# Patient Record
Sex: Male | Born: 1975 | Race: White | Hispanic: No | Smoking: Never smoker
Health system: Southern US, Community
[De-identification: ages and names within clinical notes are randomized; demographics above are authoritative.]

## PROBLEM LIST (undated history)

## (undated) DIAGNOSIS — F909 Attention-deficit hyperactivity disorder, unspecified type: Secondary | ICD-10-CM

## (undated) DIAGNOSIS — R569 Unspecified convulsions: Secondary | ICD-10-CM

## (undated) DIAGNOSIS — N289 Disorder of kidney and ureter, unspecified: Secondary | ICD-10-CM

## (undated) DIAGNOSIS — C715 Malignant neoplasm of cerebral ventricle: Secondary | ICD-10-CM

## (undated) HISTORY — PX: TONSILLECTOMY: SUR1361

## (undated) HISTORY — PX: SINUS IRRIGATION: SHX2411

## (undated) HISTORY — PX: HAND SURGERY: SHX662

## (undated) HISTORY — PX: ADENOIDECTOMY: SUR15

## (undated) HISTORY — DX: Attention-deficit hyperactivity disorder, unspecified type: F90.9

## (undated) HISTORY — PX: EXPLORATORY LAPAROTOMY: SUR591

## (undated) HISTORY — PX: APPENDECTOMY: SHX54

## (undated) HISTORY — PX: KIDNEY STONE SURGERY: SHX686

---

## 2016-12-03 ENCOUNTER — Emergency Department (HOSPITAL_COMMUNITY): Payer: No Typology Code available for payment source

## 2016-12-03 ENCOUNTER — Emergency Department (HOSPITAL_COMMUNITY)
Admission: EM | Admit: 2016-12-03 | Discharge: 2016-12-03 | Disposition: A | Discharge status: Home / Self Care | Payer: No Typology Code available for payment source | Attending: Emergency Medicine | Admitting: Emergency Medicine

## 2016-12-03 ENCOUNTER — Encounter (HOSPITAL_COMMUNITY): Payer: Self-pay | Admitting: Emergency Medicine

## 2016-12-03 DIAGNOSIS — S0181XA Laceration without foreign body of other part of head, initial encounter: Secondary | ICD-10-CM | POA: Diagnosis not present

## 2016-12-03 DIAGNOSIS — Y92410 Unspecified street and highway as the place of occurrence of the external cause: Secondary | ICD-10-CM | POA: Insufficient documentation

## 2016-12-03 DIAGNOSIS — S0990XA Unspecified injury of head, initial encounter: Secondary | ICD-10-CM | POA: Diagnosis present

## 2016-12-03 DIAGNOSIS — Y999 Unspecified external cause status: Secondary | ICD-10-CM | POA: Diagnosis not present

## 2016-12-03 DIAGNOSIS — R55 Syncope and collapse: Secondary | ICD-10-CM

## 2016-12-03 DIAGNOSIS — R930 Abnormal findings on diagnostic imaging of skull and head, not elsewhere classified: Secondary | ICD-10-CM | POA: Diagnosis not present

## 2016-12-03 DIAGNOSIS — Y939 Activity, unspecified: Secondary | ICD-10-CM | POA: Insufficient documentation

## 2016-12-03 HISTORY — DX: Disorder of kidney and ureter, unspecified: N28.9

## 2016-12-03 HISTORY — DX: Malignant neoplasm of cerebral ventricle: C71.5

## 2016-12-03 HISTORY — DX: Unspecified convulsions: R56.9
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
## 2016-12-03 NOTE — ED Notes (Signed)
Pt alert & oriented x4, stable gait. Patient given discharge instructions, paperwork & prescription(s). Patient  instructed to stop at the registration desk to finish any additional paperwork. Patient verbalized understanding. Pt left department w/ no further questions. 

## 2016-12-03 NOTE — ED Notes (Signed)
Pt c/o nausea and dizziness, no worse per pt

## 2016-12-03 NOTE — Discharge Instructions (Signed)
No acute changes were seen on your head CT. However, a cyst was noted. Please follow-up with your neurologist to assess if there has been change.
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
## 2016-12-03 NOTE — ED Notes (Signed)
Pt says he is still dizzy.

## 2016-12-03 NOTE — ED Triage Notes (Addendum)
Pt with lac noted to right eyebrow, pt states LOC for a minute per pt. CN aware and asked triage to triage next.

## 2017-06-25 ENCOUNTER — Encounter (HOSPITAL_COMMUNITY): Payer: Self-pay | Admitting: Emergency Medicine

## 2017-06-25 ENCOUNTER — Emergency Department (HOSPITAL_COMMUNITY): Payer: Medicare Other

## 2017-06-25 ENCOUNTER — Emergency Department (HOSPITAL_COMMUNITY)
Admission: EM | Admit: 2017-06-25 | Discharge: 2017-06-25 | Disposition: A | Discharge status: Home / Self Care | Payer: Medicare Other | Attending: Emergency Medicine | Admitting: Emergency Medicine

## 2017-06-25 DIAGNOSIS — Z5189 Encounter for other specified aftercare: Secondary | ICD-10-CM

## 2017-06-25 DIAGNOSIS — Z79899 Other long term (current) drug therapy: Secondary | ICD-10-CM | POA: Insufficient documentation

## 2017-06-25 DIAGNOSIS — R6883 Chills (without fever): Secondary | ICD-10-CM | POA: Insufficient documentation

## 2017-06-25 DIAGNOSIS — R110 Nausea: Secondary | ICD-10-CM | POA: Insufficient documentation

## 2017-06-25 DIAGNOSIS — R197 Diarrhea, unspecified: Secondary | ICD-10-CM | POA: Diagnosis not present

## 2017-06-25 DIAGNOSIS — Z4800 Encounter for change or removal of nonsurgical wound dressing: Secondary | ICD-10-CM | POA: Insufficient documentation

## 2017-06-25 MED ORDER — DOXYCYCLINE HYCLATE 100 MG PO CAPS: 100.0000 mg | ORAL_CAPSULE | Freq: Two times a day (BID) | ORAL | 0 refills | Status: DC

## 2017-06-25 NOTE — Discharge Instructions (Signed)
Take doxycycline twice daily for 10 days. Follow-up with PCP for further evaluation if symptoms persist. Return to ED for worsening pain, increased swelling, increased drainage, fevers, additional injury.

## 2017-06-25 NOTE — ED Triage Notes (Signed)
A month ago, cut right leg with glass, sutured with no xrays, sutures removed 10 days ago, now painful and swollen

## 2017-06-25 NOTE — ED Provider Notes (Signed)
[facility] DEPT Provider Note   CSN: [id number] Arrival date & time: 06/25/17  5643     History   Chief Complaint Chief Complaint  Patient presents with  . Wound Check    HPI Shane Gross is a 41 y.o. male.  HPI  Patient presents to ED for evaluation of right leg wound. He cut his leg with a piece of glass about one month ago. He presented to another emergency room where he states they sutured it out after "cleaning out just a little bit." States that a few days after the sutures were put in he began having swelling. He Is sutures removed 10 days ago and states that the swelling, pain and redness has gone worse. He was put on an unknown antibiotic after the incident occurred. He is concerned because they did not do an x-ray before suturing.he has also noticed a small opening in the wound. He also reports some chills, nausea, diarrhea.  Past Medical History:  Diagnosis Date  . 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 Renal disorder   . Seizures ([place])     There are no active problems to display for this patient.   Past Surgical History:  Procedure Laterality Date  . ADENOIDECTOMY    . APPENDECTOMY    . EXPLORATORY LAPAROTOMY    . HAND SURGERY Left   . KIDNEY STONE SURGERY    . SINUS IRRIGATION    . TONSILLECTOMY         Home Medications    Prior to Admission medications   Medication Sig Start Date End Date Taking? Authorizing Provider  albuterol (PROVENTIL HFA;VENTOLIN HFA) 108 (90 Base) MCG/ACT inhaler Inhale 1-2 puffs into the lungs every 6 (six) hours as needed for wheezing or shortness of breath.   Yes [provider]  amphetamine-dextroamphetamine (ADDERALL) 20 MG tablet Take 20 mg by mouth daily.   Yes [provider]  clonazePAM (KLONOPIN) 1 MG tablet Take 1 mg by mouth 2 (two) times daily.   Yes [provider]  Eslicarbazepine Acetate (APTIOM) 800 MG TABS Take 1 tablet by mouth.   Yes [provider]  fluticasone  (FLONASE) 50 MCG/ACT nasal spray Place 1 spray into both nostrils daily.   Yes [provider]  promethazine (PHENERGAN) 25 MG tablet Take 25 mg by mouth every 6 (six) hours as needed for nausea or vomiting.   Yes [provider]  doxycycline (VIBRAMYCIN) 100 MG capsule Take 1 capsule (100 mg total) by mouth 2 (two) times daily. 06/25/17   Delia Heady, PA-C    Family History Family History  Problem Relation Age of Onset  . Cancer Other     Social History Social History  Substance Use Topics  . Smoking status: Never Smoker  . Smokeless tobacco: Never Used  . Alcohol use No     Allergies   Morphine and related   Review of Systems Review of Systems  Constitutional: Positive for chills. Negative for appetite change and fever.  HENT: Negative for ear pain, rhinorrhea, sneezing and sore throat.   Eyes: Negative for photophobia and visual disturbance.  Respiratory: Negative for cough, chest tightness, shortness of breath and wheezing.   Cardiovascular: Negative for chest pain and palpitations.  Gastrointestinal: Positive for diarrhea and nausea. Negative for abdominal pain, blood in stool, constipation and vomiting.  Genitourinary: Negative for dysuria, hematuria and urgency.  Musculoskeletal: Negative for myalgias.  Skin: Positive for color change and wound. Negative for rash.  Neurological: Negative  for dizziness, weakness and light-headedness.     Physical Exam Updated Vital Signs BP 113/84 (BP Location: Right Arm)   Pulse (!) 58   Temp 97.9 F (36.6 C) (Oral)   Resp 18   Ht 6' (1.829 m)   Wt 70.3 kg (155 lb)   SpO2 97%   BMI 21.02 kg/m   Physical Exam  Constitutional: He appears well-developed and well-nourished. No distress.  Nontoxic appearing and in no acute distress.  HENT:  Head: Normocephalic and atraumatic.  Nose: Nose normal.  Eyes: Conjunctivae and EOM are normal. Left eye exhibits no discharge. No scleral icterus.  Neck: Normal range  of motion. Neck supple.  Cardiovascular: Normal rate, regular rhythm, normal heart sounds and intact distal pulses.  Exam reveals no gallop and no friction rub.   No murmur heard. Pulmonary/Chest: Effort normal and breath sounds normal. No respiratory distress.  Abdominal: Soft. Bowel sounds are normal. He exhibits no distension. There is no tenderness. There is no guarding.  Musculoskeletal: Normal range of motion. He exhibits tenderness. He exhibits no edema.       Legs: Tenderness to palpation of the right shin with healing semicircular wound. There is redness noted. Full active and passive range of motion of knee and ankle.  Neurological: He is alert. He exhibits normal muscle tone. Coordination normal.  Skin: Skin is warm and dry. No rash noted.  Psychiatric: He has a normal mood and affect.  Nursing note and vitals reviewed.    ED Treatments / Results  Labs (all labs ordered are listed, but only abnormal results are displayed) Labs Reviewed - No data to display  EKG  EKG Interpretation None       Radiology Dg Tibia/fibula Right  Result Date: 06/25/2017 CLINICAL DATA:  Infection involving the anterior right lower leg at the site of a laceration from glass approximately 1 month ago, from which sutures were removed approximately 2 weeks ago. Open draining wound currently. EXAM: RIGHT TIBIA AND FIBULA - 2 VIEW COMPARISON:  None. FINDINGS: Soft tissue swelling anteriorly overlying the mid tibia. No underlying osseous abnormality. No acute, subacute or healed fracture involving the tibia or fibula. Well preserved bone mineral density. Visualized knee joint and ankle joint intact. IMPRESSION: 1. No osseous abnormality. 2. Focal soft tissue swelling anteriorly overlying the mid tibia. Electronically Signed   By: Evangeline Dakin M.D.   On: 06/25/2017 09:00    Procedures Procedures (including critical care time)  Medications Ordered in ED Medications - No data to  display   Initial Impression / Assessment and Plan / ED Course  I have reviewed the triage vital signs and the nursing notes.  Pertinent labs & imaging results that were available during my care of the patient were reviewed by me and considered in my medical decision making (see chart for details).     Patient presents to ED for evaluation of wound check. He sustained a wound to his right shin and one month ago and sutures were placed which were removed 10 days ago. On physical exam there is tenderness to palpation and erythema noted around the site of a semicircular well-healing wound. He is otherwise nontoxic appearing and in no acute distress. He is afebrile here in the ED. X-ray showed soft tissue swelling with no acute osseous abnormality. I suspect that this could be due to either a reaction to the sutures or-a delayed infection. We'll place on doxycycline and advised follow-up with PCP for further evaluation.  Final Clinical Impressions(s) /  ED Diagnoses   Final diagnoses:  Visit for wound check    New Prescriptions New Prescriptions   DOXYCYCLINE (VIBRAMYCIN) 100 MG CAPSULE    Take 1 capsule (100 mg total) by mouth 2 (two) times daily.     Delia Heady, PA-C 06/25/17 0940    Tanna Furry, MD 07/01/17 1055

## 2018-05-14 ENCOUNTER — Encounter: Payer: Self-pay | Admitting: Internal Medicine
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
## 2018-07-07 ENCOUNTER — Encounter: Payer: Self-pay | Admitting: *Deleted

## 2018-07-07 ENCOUNTER — Ambulatory Visit (INDEPENDENT_AMBULATORY_CARE_PROVIDER_SITE_OTHER): Payer: Medicare Other | Admitting: Gastroenterology

## 2018-07-07 ENCOUNTER — Encounter: Payer: Self-pay | Admitting: Gastroenterology

## 2018-07-07 VITALS — BP 109/73 | HR 68 | Temp 98.1°F | Ht 72.0 in | Wt 153.2 lb

## 2018-07-07 DIAGNOSIS — R1084 Generalized abdominal pain: Secondary | ICD-10-CM | POA: Diagnosis not present

## 2018-07-07 DIAGNOSIS — R109 Unspecified abdominal pain: Secondary | ICD-10-CM | POA: Insufficient documentation

## 2018-07-07 DIAGNOSIS — K5900 Constipation, unspecified: Secondary | ICD-10-CM | POA: Insufficient documentation

## 2018-07-07 NOTE — Patient Instructions (Signed)
I have ordered blood work to be done today.   I have also ordered a CT scan to hopefully be done tomorrow. Once this is completed, we can decide the next best step. Don't start Linzess until we call about the CT scan. You will need to take it once daily on an empty stomach, 30 minutes before breakfast.  Further recommendations to follow!  It was a pleasure to see you today. I strive to create trusting relationships with patients to provide genuine, compassionate, and quality care. I value your feedback. If you receive a survey regarding your visit,  I greatly appreciate you taking time to fill this out.   Annitta Needs, PhD, ANP-BC [facility]
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
## 2018-07-08 ENCOUNTER — Ambulatory Visit (HOSPITAL_COMMUNITY)
Admission: RE | Admit: 2018-07-08 | Discharge: 2018-07-08 | Disposition: A | Discharge status: Home / Self Care | Payer: Medicare Other | Source: Ambulatory Visit | Attending: Gastroenterology | Admitting: Gastroenterology

## 2018-07-08 DIAGNOSIS — R1084 Generalized abdominal pain: Secondary | ICD-10-CM

## 2018-07-08 DIAGNOSIS — K5900 Constipation, unspecified: Secondary | ICD-10-CM | POA: Insufficient documentation

## 2018-07-08 LAB — COMPLETE METABOLIC PANEL WITH GFR
AG Ratio: 1.6 (calc) (ref 1.0–2.5)
ALKALINE PHOSPHATASE (APISO): 47 U/L (ref 40–115)
ALT: 12 U/L (ref 9–46)
AST: 16 U/L (ref 10–40)
Albumin: 4.4 g/dL (ref 3.6–5.1)
BUN: 11 mg/dL (ref 7–25)
CO2: 26 mmol/L (ref 20–32)
CREATININE: 1.06 mg/dL (ref 0.60–1.35)
Calcium: 9.3 mg/dL (ref 8.6–10.3)
Chloride: 103 mmol/L (ref 98–110)
GFR, Est African American: 99 mL/min/{1.73_m2} (ref >=60)
GFR, Est Non African American: 86 mL/min/{1.73_m2} (ref >=60)
Globulin: 2.7 g/dL (calc) (ref 1.9–3.7)
Glucose, Bld: 98 mg/dL (ref 65–139)
Potassium: 3.9 mmol/L (ref 3.5–5.3)
Sodium: 138 mmol/L (ref 135–146)
Total Bilirubin: 0.5 mg/dL (ref 0.2–1.2)
Total Protein: 7.1 g/dL (ref 6.1–8.1)

## 2018-07-08 LAB — CBC WITH DIFFERENTIAL/PLATELET
BASOS PCT: 0.9 %
Basophils Absolute: 61 cells/uL (ref 0–200)
EOS ABS: 252 {cells}/uL (ref 15–500)
EOS PCT: 3.7 %
HCT: 44.2 % (ref 38.5–50.0)
HEMOGLOBIN: 15.3 g/dL (ref 13.2–17.1)
Lymphs Abs: 2346 cells/uL (ref 850–3900)
MCH: 32.1 pg (ref 27.0–33.0)
MCHC: 34.6 g/dL (ref 32.0–36.0)
MCV: 92.7 fL (ref 80.0–100.0)
MPV: 10.8 fL (ref 7.5–12.5)
Monocytes Relative: 13 %
NEUTROS ABS: 3257 {cells}/uL (ref 1500–7800)
Neutrophils Relative %: 47.9 %
Platelets: 204 10*3/uL (ref 140–400)
RBC: 4.77 10*6/uL (ref 4.20–5.80)
RDW: 12.3 % (ref 11.0–15.0)
Total Lymphocyte: 34.5 %
WBC mixed population: 884 cells/uL (ref 200–950)
WBC: 6.8 10*3/uL (ref 3.8–10.8)

## 2018-07-08 LAB — TSH: TSH: 3.96 m[IU]/L (ref 0.40–4.50)

## 2018-07-08 MED ORDER — IOPAMIDOL (ISOVUE-300) INJECTION 61%
100.0000 mL | Freq: Once | INTRAVENOUS | Status: AC | PRN
  Administered 2018-07-08: 100 mL via INTRAVENOUS

## 2018-07-08 NOTE — Progress Notes (Signed)
CT abd/pelvis unrevealing, which is good! Needs to start Linzess 290 mcg daily. Labs are all normal as well. Give [place] an update in 1 week.

## 2018-07-13 ENCOUNTER — Telehealth: Payer: Self-pay | Admitting: Gastroenterology

## 2018-07-13 NOTE — Assessment & Plan Note (Signed)
CT first. Anticipate starting Linzess 290 mcg daily.

## 2018-07-13 NOTE — Assessment & Plan Note (Addendum)
42 year old male with one-year history of abdominal pain, constant in nature, associated constipation. Change in bowel habits with scant hematochezia in past rarely. No improvement with OTC agents. No weight loss. Multifactorial in setting of constipation, chronic GERD. Will arrange CT abd/pelvis. Anticipate starting Linzess 290 mcg thereafter. Return in follow-up thereafter to re-evaluate. May need colonoscopy +/- EGD. Continue Prilosec daily. CBC, CMP, TSH ordered.

## 2018-07-13 NOTE — Telephone Encounter (Signed)
Patient needs to return in follow-up in 3 months. Please have him call with update regarding Linzess.

## 2018-07-14 ENCOUNTER — Encounter: Payer: Self-pay | Admitting: Internal Medicine

## 2018-07-14 NOTE — Progress Notes (Signed)
CC'D TO PCP °

## 2018-07-14 NOTE — Telephone Encounter (Signed)
Patient scheduled and letter sent  °

## 2018-08-18 ENCOUNTER — Ambulatory Visit: Payer: Medicare Other | Admitting: Gastroenterology

## 2018-08-18 ENCOUNTER — Encounter

## 2018-10-21 NOTE — Progress Notes (Deleted)
Shane Gross is a 43 y.o. male presenting in folow-up with history of abdominal pain, constipation, GERD. CT abd/pelvis completed after last visit without acute findings, with recommendations to start Linzess 290 mcg thereafter. Dicussed possible colonoscopy due to rectal bleeding.

## 2018-10-22 ENCOUNTER — Ambulatory Visit: Payer: Medicare Other | Admitting: Gastroenterology

## 2018-10-22 ENCOUNTER — Telehealth: Payer: Self-pay | Admitting: Internal Medicine

## 2018-10-22 ENCOUNTER — Encounter: Payer: Self-pay | Admitting: Gastroenterology

## 2018-10-22 NOTE — Telephone Encounter (Signed)
PATIENT WAS A NO SHOW AND LETTER SENT  °

## 2018-12-23 IMAGING — DX DG TIBIA/FIBULA 2V*R*
2 series · 2 of 2 positions shown · non-contrast
Comparison: None.

CLINICAL DATA: Infection involving the anterior right lower leg at
the site of a laceration from glass approximately 1 month ago, from
which sutures were removed approximately 2 weeks ago. Open draining
wound currently.

EXAM:
RIGHT TIBIA AND FIBULA - 2 VIEW

[tibia ap]
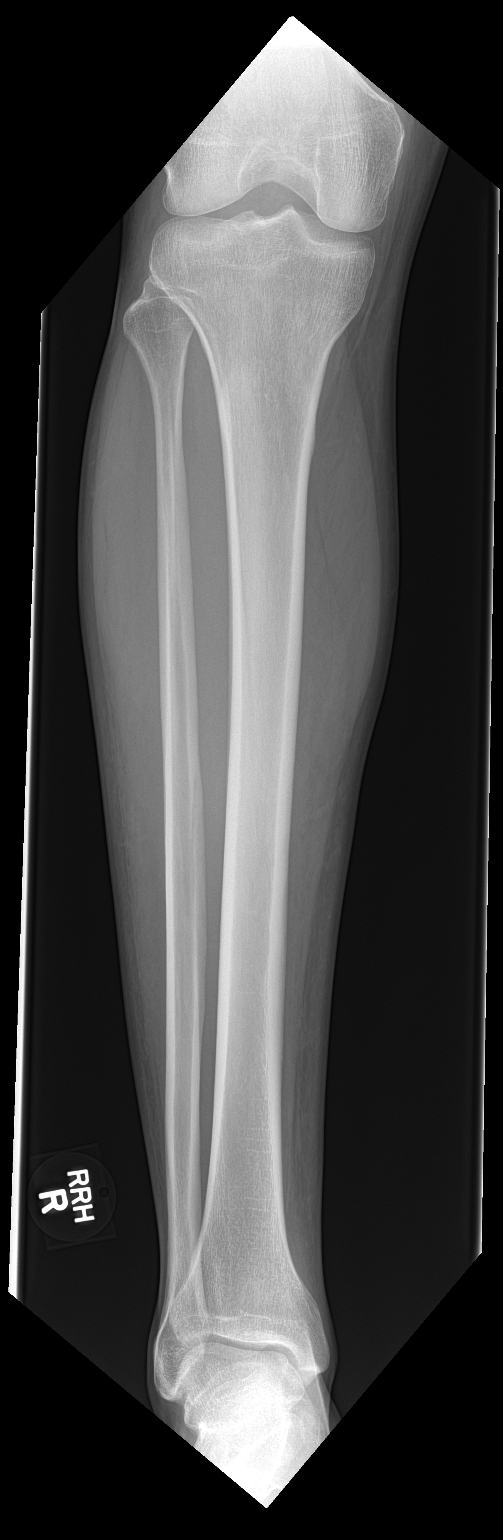

[tibia lat]
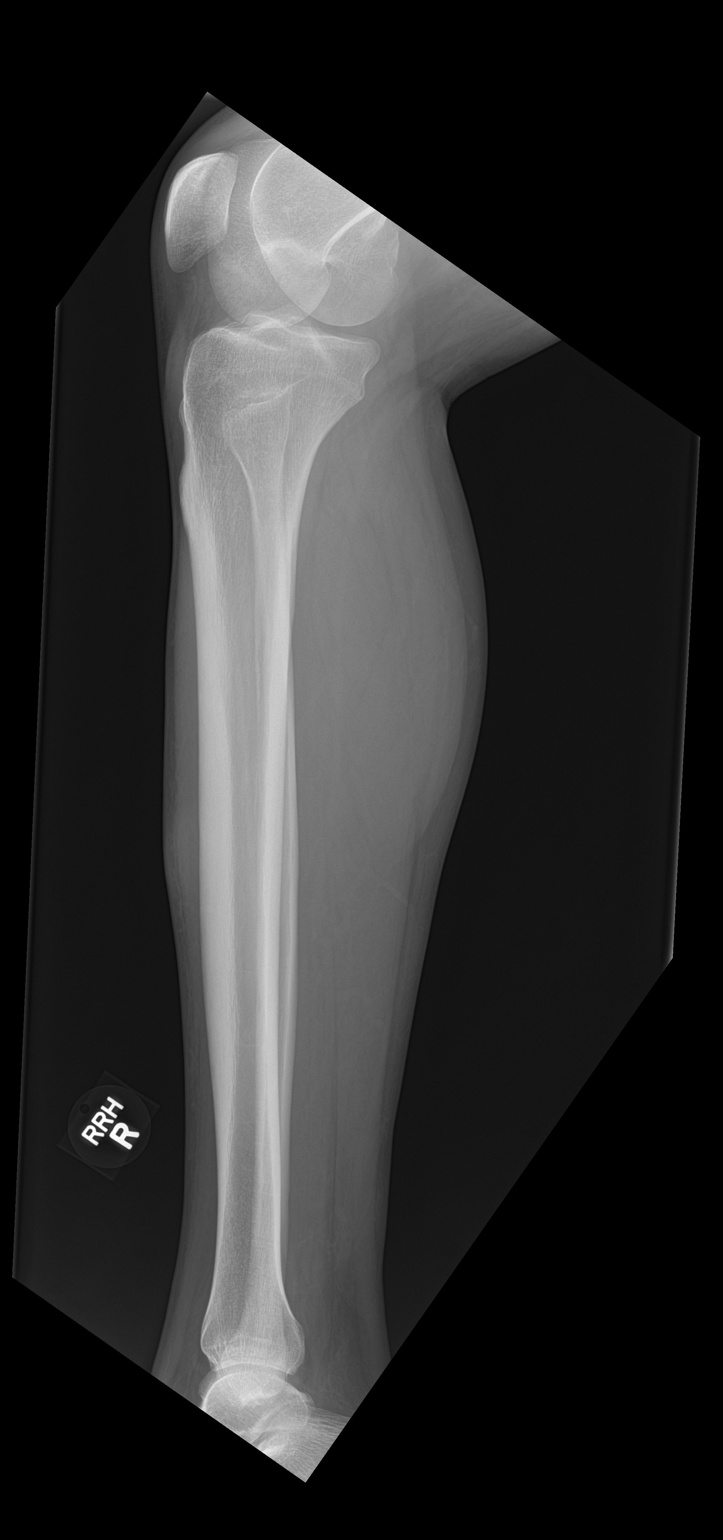

[2 of 2 positions shown; findings below may reference images not displayed]

FINDINGS: Soft tissue swelling anteriorly overlying the mid tibia. No
underlying osseous abnormality. No acute, subacute or healed
fracture involving the tibia or fibula. Well preserved bone mineral
density. Visualized knee joint and ankle joint intact.
IMPRESSION: 1. No osseous abnormality.
2. Focal soft tissue swelling anteriorly overlying the mid tibia.

## 2020-05-29 ENCOUNTER — Other Ambulatory Visit: Payer: Self-pay

## 2020-05-29 ENCOUNTER — Encounter (HOSPITAL_COMMUNITY): Payer: Self-pay | Admitting: Emergency Medicine

## 2020-05-29 ENCOUNTER — Emergency Department (HOSPITAL_COMMUNITY)
Admission: EM | Admit: 2020-05-29 | Discharge: 2020-05-29 | Disposition: A | Discharge status: Home / Self Care | Payer: 59 | Attending: Emergency Medicine | Admitting: Emergency Medicine

## 2020-05-29 DIAGNOSIS — F909 Attention-deficit hyperactivity disorder, unspecified type: Secondary | ICD-10-CM | POA: Insufficient documentation

## 2020-05-29 DIAGNOSIS — L255 Unspecified contact dermatitis due to plants, except food: Secondary | ICD-10-CM | POA: Insufficient documentation

## 2020-05-29 DIAGNOSIS — R21 Rash and other nonspecific skin eruption: Secondary | ICD-10-CM | POA: Diagnosis present

## 2020-05-29 MED ORDER — DEXAMETHASONE SODIUM PHOSPHATE 10 MG/ML IJ SOLN
10.0000 mg | Freq: Once | INTRAMUSCULAR | Status: AC
  Administered 2020-05-29: 10 mg via INTRAMUSCULAR
  Filled 2020-05-29: qty 1

## 2020-05-29 MED ORDER — ONDANSETRON 8 MG PO TBDP
8.0000 mg | ORAL_TABLET | Freq: Once | ORAL | Status: AC
  Administered 2020-05-29: 8 mg via ORAL
  Filled 2020-05-29: qty 1

## 2020-05-29 MED ORDER — HYDROXYZINE HCL 25 MG PO TABS: 25.0000 mg | ORAL_TABLET | Freq: Four times a day (QID) | ORAL | 0 refills | Status: AC | PRN

## 2020-05-29 MED ORDER — PREDNISONE 10 MG PO TABS: ORAL_TABLET | ORAL | 0 refills | Status: AC

## 2020-05-29 NOTE — Discharge Instructions (Signed)
Start the prednisone prescription tomorrow.  Avoid hot steamy showers or excessive sweating.  The prescription hydroxyzine will help with itching, but may cause drowsiness.
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
## 2020-05-29 NOTE — ED Notes (Signed)
Pt states he has an emergency and has to leave. Pt seen leaving emergency room. PA notified

## 2020-05-29 NOTE — ED Triage Notes (Signed)
Pt report working in woods on Monday and was treated for rash on Tuesday at [facility] and given steroid shot and cream.  Pt says rash is not gotten better.  Fine rash noted to trunk, legs and back.  C/o itching.

## 2020-05-29 NOTE — ED Notes (Signed)
RN unable to perform Pain assessment prior to discharge or obtain updated vital signs.

## 2020-05-29 NOTE — ED Notes (Signed)
Pt not in room when RN went to speak with Pt. Pt had left msg with previous RN he had an emergency and had to leave before receiving discharge paperwork. This RN spoke with EDP and updated on Pts status. This RN called the Pt and read discharge instructions to Pt via telephone and informed Pt his Prescriptions were sent to the desired pharmacy for pickup. Pt verbally understood information.
